# Patient Record
Sex: Male | Born: 1990 | Race: Black or African American | Hispanic: No | Marital: Single | Smoking: Current every day smoker
Health system: Southern US, Community
[De-identification: ages and names within clinical notes are randomized; demographics above are authoritative.]

---
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
## 2009-02-03 ENCOUNTER — Emergency Department (HOSPITAL_COMMUNITY): Admission: EM | Admit: 2009-02-03 | Discharge: 2009-02-03 | Payer: Self-pay | Admitting: Family Medicine

## 2010-08-28 LAB — GC/CHLAMYDIA PROBE AMP, GENITAL
Chlamydia, DNA Probe: NEGATIVE
GC Probe Amp, Genital: POSITIVE — AB

## 2011-02-18 LAB — GC/CHLAMYDIA PROBE AMP, GENITAL: GC Probe Amp, Genital: POSITIVE — AB

## 2011-02-18 LAB — POCT URINALYSIS DIP (DEVICE)
Nitrite: NEGATIVE
pH: 6.5

## 2011-02-18 LAB — URINE CULTURE
Colony Count: NO GROWTH
Culture: NO GROWTH

## 2011-12-19 ENCOUNTER — Ambulatory Visit (INDEPENDENT_AMBULATORY_CARE_PROVIDER_SITE_OTHER): Payer: Self-pay | Admitting: Family Medicine

## 2011-12-19 ENCOUNTER — Other Ambulatory Visit: Payer: Self-pay | Admitting: Family Medicine

## 2011-12-19 VITALS — BP 117/77 | HR 58 | Temp 98.2°F | Resp 16 | Ht 69.25 in | Wt 236.6 lb

## 2011-12-19 DIAGNOSIS — A5429 Other gonococcal genitourinary infections: Secondary | ICD-10-CM

## 2011-12-19 DIAGNOSIS — Z202 Contact with and (suspected) exposure to infections with a predominantly sexual mode of transmission: Secondary | ICD-10-CM

## 2011-12-19 DIAGNOSIS — Z2089 Contact with and (suspected) exposure to other communicable diseases: Secondary | ICD-10-CM

## 2011-12-19 DIAGNOSIS — A5401 Gonococcal cystitis and urethritis, unspecified: Secondary | ICD-10-CM

## 2011-12-19 LAB — HIV ANTIBODY (ROUTINE TESTING W REFLEX): HIV: NONREACTIVE

## 2011-12-19 LAB — RPR

## 2011-12-19 MED ORDER — CEFTRIAXONE SODIUM 1 G IJ SOLR
250.0000 mg | Freq: Once | INTRAMUSCULAR | Status: AC
  Administered 2011-12-19: 250 mg via INTRAMUSCULAR

## 2011-12-19 MED ORDER — CEFTRIAXONE SODIUM 1 G IJ SOLR: 250.0000 mg | INTRAMUSCULAR | Status: DC

## 2011-12-19 NOTE — Patient Instructions (Signed)
Your should receive a call or letter about your lab results within the next week to 10 days.  Return to the clinic or go to the nearest emergency room if any of your symptoms worsen or new symptoms occur.  

## 2011-12-19 NOTE — Progress Notes (Signed)
  Subjective:    Patient ID: Dylan Luna, male    DOB: 16-Nov-1990, 21 y.o.   MRN: [id number]  HPI Dylan Luna is a 21 y.o. male Partner recently diagnosed with gonorrhea.  Here for testing.  No dysuria or discharge, no pain in testicles or other symptoms. unproteced sex recently with this partner.   Few years ago had gonorrhea.  Treated with shot.  No know test of cure.      Review of Systems  Constitutional: Negative for fever and chills.  HENT: Negative for sore throat.   Respiratory: Negative for cough.   Genitourinary: Negative for dysuria, urgency, frequency, hematuria, difficulty urinating, penile pain and testicular pain.  Skin: Negative for rash.      Objective:   Physical Exam  Constitutional: He is oriented to person, place, and time. He appears well-developed and well-nourished.  HENT:  Head: Normocephalic and atraumatic.  Mouth/Throat: Oropharynx is clear and moist. No oropharyngeal exudate.  Eyes: EOM are normal. Pupils are equal, round, and reactive to light.  Pulmonary/Chest: Effort normal.  Genitourinary: Testes normal. Right testis shows no tenderness. Left testis shows no tenderness. No penile erythema or penile tenderness. Discharge (white discharge at urethral meatus.) found.  Lymphadenopathy:    He has no cervical adenopathy.       Right: No inguinal adenopathy present.       Left: No inguinal adenopathy present.  Neurological: He is alert and oriented to person, place, and time.  Skin: Skin is warm and dry. No rash noted.  Psychiatric: He has a normal mood and affect. His behavior is normal.          Assessment & Plan:  Dylan Luna is a 21 y.o. male 1. Exposure to STD  HIV antibody, RPR, GC/chlamydia probe amp, urine, Hepatitis C antibody, Hepatitis B surface antibody, Hepatitis B surface antigen, HSV(herpes simplex vrs) 1+2 ab-IgG, cefTRIAXone (ROCEPHIN) injection 250 mg, DISCONTINUED: cefTRIAXone (ROCEPHIN) injection 250 mg  2.  Gonococcal urethritis in male  HIV antibody, RPR, GC/chlamydia probe amp, urine, Hepatitis C antibody, Hepatitis B surface antibody, Hepatitis B surface antigen, HSV(herpes simplex vrs) 1+2 ab-IgG, cefTRIAXone (ROCEPHIN) injection 250 mg, DISCONTINUED: cefTRIAXone (ROCEPHIN) injection 250 mg   Rocephin 250mg  im given for gonorrhea exposure and likely gonococcal ureththritis Check other STI tests as above, abstinence or condoms next 1 week, and optional test of cure in 2-3 weeks.  RTC precautions.

## 2011-12-21 LAB — GC/CHLAMYDIA PROBE AMP, URINE: GC Probe Amp, Urine: NEGATIVE

## 2011-12-24 ENCOUNTER — Other Ambulatory Visit: Payer: Self-pay | Admitting: Family Medicine

## 2011-12-24 MED ORDER — AZITHROMYCIN 250 MG PO TABS: ORAL_TABLET | ORAL | Status: AC

## 2014-06-09 ENCOUNTER — Emergency Department (HOSPITAL_COMMUNITY)
Admission: EM | Admit: 2014-06-09 | Discharge: 2014-06-09 | Disposition: A | Discharge status: Home / Self Care | Payer: Self-pay | Attending: Emergency Medicine | Admitting: Emergency Medicine

## 2014-06-09 ENCOUNTER — Encounter (HOSPITAL_COMMUNITY): Payer: Self-pay | Admitting: *Deleted

## 2014-06-09 DIAGNOSIS — R21 Rash and other nonspecific skin eruption: Secondary | ICD-10-CM | POA: Insufficient documentation

## 2014-06-09 DIAGNOSIS — N4889 Other specified disorders of penis: Secondary | ICD-10-CM | POA: Insufficient documentation

## 2014-06-09 DIAGNOSIS — Z720 Tobacco use: Secondary | ICD-10-CM | POA: Insufficient documentation

## 2014-06-09 DIAGNOSIS — N509 Disorder of male genital organs, unspecified: Secondary | ICD-10-CM | POA: Insufficient documentation

## 2014-06-09 LAB — URINALYSIS, ROUTINE W REFLEX MICROSCOPIC
BILIRUBIN URINE: NEGATIVE
Glucose, UA: NEGATIVE mg/dL
KETONES UR: NEGATIVE mg/dL
LEUKOCYTES UA: NEGATIVE
NITRITE: NEGATIVE
PH: 5.5 (ref 5.0–8.0)
PROTEIN: NEGATIVE mg/dL
SPECIFIC GRAVITY, URINE: 1.031 — AB (ref 1.005–1.030)
UROBILINOGEN UA: 0.2 mg/dL (ref 0.0–1.0)

## 2014-06-09 LAB — URINE MICROSCOPIC-ADD ON

## 2014-06-09 MED ORDER — CEFTRIAXONE SODIUM 250 MG IJ SOLR
250.0000 mg | Freq: Once | INTRAMUSCULAR | Status: AC
  Administered 2014-06-09: 250 mg via INTRAMUSCULAR
  Filled 2014-06-09: qty 250

## 2014-06-09 MED ORDER — PENICILLIN G BENZATHINE 1200000 UNIT/2ML IM SUSP
2.4000 10*6.[IU] | Freq: Once | INTRAMUSCULAR | Status: AC
  Administered 2014-06-09: 2.4 10*6.[IU] via INTRAMUSCULAR
  Filled 2014-06-09: qty 4

## 2014-06-09 MED ORDER — AZITHROMYCIN 250 MG PO TABS
1000.0000 mg | ORAL_TABLET | Freq: Once | ORAL | Status: AC
  Administered 2014-06-09: 1000 mg via ORAL
  Filled 2014-06-09: qty 4

## 2014-06-09 NOTE — Discharge Instructions (Signed)
You have been evaluated for possible STD.  If tested positive, you will be notified.  Do not engage in any sexual activities until your rash has completely disappear.  Avoid shaving your penis to prevent rash.     Sexually Transmitted Disease A sexually transmitted disease (STD) is a disease or infection that may be passed (transmitted) from person to person, usually during sexual activity. This may happen by way of saliva, semen, blood, vaginal mucus, or urine. Common STDs include:   Gonorrhea.   Chlamydia.   Syphilis.   HIV and AIDS.   Genital herpes.   Hepatitis B and C.   Trichomonas.   Human papillomavirus (HPV).   Pubic lice.   Scabies.  Mites.  Bacterial vaginosis. WHAT ARE CAUSES OF STDs? An STD may be caused by bacteria, a virus, or parasites. STDs are often transmitted during sexual activity if one person is infected. However, they may also be transmitted through nonsexual means. STDs may be transmitted after:   Sexual intercourse with an infected person.   Sharing sex toys with an infected person.   Sharing needles with an infected person or using unclean piercing or tattoo needles.  Having intimate contact with the genitals, mouth, or rectal areas of an infected person.   Exposure to infected fluids during birth. WHAT ARE THE SIGNS AND SYMPTOMS OF STDs? Different STDs have different symptoms. Some people may not have any symptoms. If symptoms are present, they may include:   Painful or bloody urination.   Pain in the pelvis, abdomen, vagina, anus, throat, or eyes.   A skin rash, itching, or irritation.  Growths, ulcerations, blisters, or sores in the genital and anal areas.  Abnormal vaginal discharge with or without bad odor.   Penile discharge in men.   Fever.   Pain or bleeding during sexual intercourse.   Swollen glands in the groin area.   Yellow skin and eyes (jaundice). This is seen with hepatitis.   Swollen  testicles.  Infertility.  Sores and blisters in the mouth. HOW ARE STDs DIAGNOSED? To make a diagnosis, your health care provider may:   Take a medical history.   Perform a physical exam.   Take a sample of any discharge to examine.  Swab the throat, cervix, opening to the penis, rectum, or vagina for testing.  Test a sample of your first morning urine.   Perform blood tests.   Perform a Pap test, if this applies.   Perform a colposcopy.   Perform a laparoscopy.  HOW ARE STDs TREATED? Treatment depends on the STD. Some STDs may be treated but not cured.   Chlamydia, gonorrhea, trichomonas, and syphilis can be cured with antibiotic medicine.   Genital herpes, hepatitis, and HIV can be treated, but not cured, with prescribed medicines. The medicines lessen symptoms.   Genital warts from HPV can be treated with medicine or by freezing, burning (electrocautery), or surgery. Warts may come back.   HPV cannot be cured with medicine or surgery. However, abnormal areas may be removed from the cervix, vagina, or vulva.   If your diagnosis is confirmed, your recent sexual partners need treatment. This is true even if they are symptom-free or have a negative culture or evaluation. They should not have sex until their health care providers say it is okay. HOW CAN I REDUCE MY RISK OF GETTING AN STD? Take these steps to reduce your risk of getting an STD:  Use latex condoms, dental dams, and water-soluble lubricants during sexual  activity. Do not use petroleum jelly or oils.  Avoid having multiple sex partners.  Do not have sex with someone who has other sex partners.  Do not have sex with anyone you do not know or who is at high risk for an STD.  Avoid risky sex practices that can break your skin.  Do not have sex if you have open sores on your mouth or skin.  Avoid drinking too much alcohol or taking illegal drugs. Alcohol and drugs can affect your judgment and put  you in a vulnerable position.  Avoid engaging in oral and anal sex acts.  Get vaccinated for HPV and hepatitis. If you have not received these vaccines in the past, talk to your health care provider about whether one or both might be right for you.   If you are at risk of being infected with HIV, it is recommended that you take a prescription medicine daily to prevent HIV infection. This is called pre-exposure prophylaxis (PrEP). You are considered at risk if:  You are a man who has sex with other men (MSM).  You are a heterosexual man or woman and are sexually active with more than one partner.  You take drugs by injection.  You are sexually active with a partner who has HIV.  Talk with your health care provider about whether you are at high risk of being infected with HIV. If you choose to begin PrEP, you should first be tested for HIV. You should then be tested every 3 months for as long as you are taking PrEP.  WHAT SHOULD I DO IF I THINK I HAVE AN STD?  See your health care provider.   Tell your sexual partner(s). They should be tested and treated for any STDs.  Do not have sex until your health care provider says it is okay. WHEN SHOULD I GET IMMEDIATE MEDICAL CARE? Contact your health care provider right away if:   You have severe abdominal pain.  You are a man and notice swelling or pain in your testicles.  You are a woman and notice swelling or pain in your vagina. Document Released: 07/31/2002 Document Revised: 05/15/2013 Document Reviewed: 11/28/2012 [facility] Patient Information 2015 [place], [place]. This information is not intended to replace advice given to you by your health care provider. Make sure you discuss any questions you have with your health care provider.

## 2014-06-09 NOTE — ED Notes (Signed)
Declined W/C at D/C and was escorted to lobby by RN. 

## 2014-06-09 NOTE — ED Provider Notes (Signed)
CSN: [id number]     Arrival date & time 06/09/14  0902 History  This chart was scribed for Fayrene HelperBowie Alon Mazor, PA-C, working with Merrie RoofJohn David Wofford III, * by Chestine SporeSoijett Blue, ED Scribe. The patient was seen in room TR11C/TR11C at 9:24 AM.    Chief Complaint  Patient presents with  . Laceration    The history is provided by the patient. No language interpreter was used.    HPI Comments: Dylan Luna is a 24 y.o. male who presents to the Emergency Department complaining of lesion on penis onset 2 weeks ago. He notes that he may have shaved and he accidentally knicked the skin on penis. He didn't notice the cut initially. He noticed that the area opened up and began to scab over. He noticed a spot 1 week ago with no pain. Two days ago he noticed he had painful urination and that his penis was throbbing. on his penis that is painful. He notes that he has two sexual partners and that he uses condoms for intercourse but not for oral sex. He would like to be tested for a STD as well. He states that he is having associated symptoms of dysuria, genital sore, and penile pain. He denies penile discharge, fever, abdominal pain, back pain, testicular pain, and any other symptoms. Denies any prior STD, but he has been tested before. Denies being allergic to any medication. He reports that he is otherwise healthy.   History reviewed. No pertinent past medical history. History reviewed. No pertinent past surgical history. History reviewed. No pertinent family history. History  Substance Use Topics  . Smoking status: Current Every Day Smoker    Types: Cigarettes  . Smokeless tobacco: Not on file  . Alcohol Use: No    Review of Systems  Gastrointestinal: Negative for abdominal pain.  Genitourinary: Positive for dysuria, genital sores and penile pain. Negative for discharge and testicular pain.  Musculoskeletal: Negative for back pain.      Allergies  Review of patient's allergies indicates no known  allergies.  Home Medications   Prior to Admission medications   Not on File   BP 136/74 mmHg  Pulse 78  Temp(Src) 98.2 F (36.8 C) (Oral)  Resp 16  SpO2 98%  Physical Exam  Constitutional: He is oriented to person, place, and time. He appears well-developed and well-nourished. No distress.  HENT:  Head: Normocephalic and atraumatic.  Eyes: EOM are normal.  Neck: Neck supple. No tracheal deviation present.  Cardiovascular: Normal rate.   Pulmonary/Chest: Effort normal. No respiratory distress.  Abdominal: Soft. There is no tenderness. Hernia confirmed negative in the right inguinal area and confirmed negative in the left inguinal area.  Genitourinary: Testes normal.    Circumcised. No penile tenderness. No discharge found.  Non healing ulceration noted to th penile shaft without any exudate or puss. Non-tender to palpation. Multiple follicular regions noted throughout the shaft without puss or exudate. No penile discharge. No hernia noted.   Musculoskeletal: Normal range of motion.  Neurological: He is alert and oriented to person, place, and time.  Skin: Skin is warm and dry.  Psychiatric: He has a normal mood and affect. His behavior is normal.  Nursing note and vitals reviewed.   ED Course  Procedures (including critical care time) DIAGNOSTIC STUDIES: Oxygen Saturation is 98% on room air, normal by my interpretation.    COORDINATION OF CARE: 9:29 AM-Discussed treatment plan which includes penicillin, RPR, HIV antibody, UA with pt at bedside and pt agreed  to plan.   9:31 AM- pt was tested and positive for gonorrhea at [facility] 2010. 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   9:58 AM Patient here with a nonhealing nontender ulcer to the shaft of his penis in which she correspond with shaving. It appears to be a chancre concerning for syphilis. Therefore, STD panel testing was ordered. Patient will be treated prophylactically with penicillin G 2.4  million unit along with Rocephin and Zithromax. Patient warn about possible Jarisch-Herxheimer reaction from receiving medication. There are also smaller lesions along the penile shaft, suspect herpetic lesion that has deroofed.  Doubt that it was from cuts due to shaving.  Patient made aware to avoid sexual activities until symptoms completely resolved. Safe Sex practices discussed.  Labs Review Labs Reviewed  URINALYSIS, ROUTINE W REFLEX MICROSCOPIC - Abnormal; Notable for the following:    Specific Gravity, Urine 1.031 (*)    Hgb urine dipstick SMALL (*)    All other components within normal limits  URINE MICROSCOPIC-ADD ON  RPR  HIV ANTIBODY (ROUTINE TESTING)  GC/CHLAMYDIA PROBE AMP ([place])    Imaging Review No results found.   EKG Interpretation None      MDM   Final diagnoses:  Penile rash    BP 136/74 mmHg  Pulse 78  Temp(Src) 98.2 F (36.8 C) (Oral)  Resp 16  SpO2 98%   I personally performed the services described in this documentation, which was scribed in my presence. The recorded information has been reviewed and is accurate.    Fayrene Helper, PA-C 06/09/14 1031  Candyce Churn III, MD 06/10/14 [phone]

## 2014-06-09 NOTE — ED Notes (Addendum)
Pt reports accidentally cutting his penis several days ago while shaving and wants it evaluated. Denies any discharge from wound or fever. Also wants evaluated for std.

## 2014-06-10 LAB — HIV ANTIBODY (ROUTINE TESTING W REFLEX): HIV 1/HIV 2 AB: NONREACTIVE

## 2014-06-10 LAB — GC/CHLAMYDIA PROBE AMP (~~LOC~~) NOT AT ARMC
CHLAMYDIA, DNA PROBE: NEGATIVE
NEISSERIA GONORRHEA: NEGATIVE

## 2014-06-17 LAB — RPR: RPR: NONREACTIVE

## 2016-04-12 ENCOUNTER — Encounter (HOSPITAL_COMMUNITY): Payer: Self-pay | Admitting: Emergency Medicine

## 2016-04-12 ENCOUNTER — Emergency Department (HOSPITAL_COMMUNITY)
Admission: EM | Admit: 2016-04-12 | Discharge: 2016-04-12 | Disposition: A | Discharge status: Home / Self Care | Payer: Self-pay | Attending: Emergency Medicine | Admitting: Emergency Medicine

## 2016-04-12 DIAGNOSIS — R300 Dysuria: Secondary | ICD-10-CM | POA: Insufficient documentation

## 2016-04-12 DIAGNOSIS — F17210 Nicotine dependence, cigarettes, uncomplicated: Secondary | ICD-10-CM | POA: Insufficient documentation

## 2016-04-12 DIAGNOSIS — Z202 Contact with and (suspected) exposure to infections with a predominantly sexual mode of transmission: Secondary | ICD-10-CM | POA: Insufficient documentation

## 2016-04-12 LAB — URINALYSIS, ROUTINE W REFLEX MICROSCOPIC
Bilirubin Urine: NEGATIVE
GLUCOSE, UA: NEGATIVE mg/dL
HGB URINE DIPSTICK: NEGATIVE
KETONES UR: NEGATIVE mg/dL
Leukocytes, UA: NEGATIVE
Nitrite: NEGATIVE
PH: 6.5 (ref 5.0–8.0)
PROTEIN: NEGATIVE mg/dL
Specific Gravity, Urine: 1.031 — ABNORMAL HIGH (ref 1.005–1.030)

## 2016-04-12 LAB — GC/CHLAMYDIA PROBE AMP (~~LOC~~) NOT AT ARMC
Chlamydia: NEGATIVE
Neisseria Gonorrhea: NEGATIVE

## 2016-04-12 LAB — RPR: RPR Ser Ql: NONREACTIVE

## 2016-04-12 LAB — HIV ANTIBODY (ROUTINE TESTING W REFLEX): HIV SCREEN 4TH GENERATION: NONREACTIVE

## 2016-04-12 MED ORDER — METRONIDAZOLE 500 MG PO TABS
2000.0000 mg | ORAL_TABLET | Freq: Once | ORAL | Status: AC
  Administered 2016-04-12: 2000 mg via ORAL
  Filled 2016-04-12: qty 4

## 2016-04-12 NOTE — Discharge Instructions (Signed)
1. Medications: usual home medications °2. Treatment: rest, drink plenty of fluids, use a condom with every sexual encounter °3. Follow Up: Please followup with your primary doctor in 3 days for discussion of your diagnoses and further evaluation after today's visit; if you do not have a primary care doctor use the resource guide provided to find one; Please return to the ER for worsening symptoms, high fevers or persistent vomiting. ° °You have been tested for HIV, syphilis, chlamydia and gonorrhea.  These results will be available in approximately 3 days.  Please inform all sexual partners if you test positive for any of these diseases.  ° °[facility] [place] [facility] tests for every STD. There are 9 other clinics in [place], but only 3 of them offer such comprehensive testing. ° °Services Offered: °Testing Services  °Chlamydia Testing °Conventional Blood HIV Testing °Gonorrhea Testing °Hepatitis B Testing °Hepatitis C Testing °Herpes Testing °Syphilis Testing °TB Testing ° °Vaccines And Treatments:  °Hepatitis B Vaccine °Hepatitis A Vaccine °HPV Vaccine °TB Treatment °Gynecological Care °Family Planning ° °Prevention Services:  °HIV Test Counseling °HIV/AIDS Prevention Education °Safer Sex Education °Spe[employer] [place] °STD Prevention/Education °TB Prevention Education ° °Languages Spoken: °English °Spanish ° °Hours of Operation: °Note: Please contact the organization for hours of operation. Disclaimer: Hours of peration change frequently. Please contact the organization to verify. °Appointment Required: Yes ° °Contact Information: °Phone [phone] °Other Phones [phone] (Domestic Fax) ° °Address: °[address] °[place], [place] [address] ° °Website: [email or web address]  °

## 2016-04-12 NOTE — ED Triage Notes (Signed)
Pt reports being exposed to trichomonas. Pt also reports burning when urinating.

## 2016-04-12 NOTE — ED Provider Notes (Signed)
[facility] Provider Note   CSN: [id number] Arrival date & time: 04/12/16  0703     History   Chief Complaint Chief Complaint  Patient presents with  . Exposure to STD    HPI Dylan Luna is a 25 y.o. male.  HPI   Patient is a 25 year old male who presents the ED with complaint of a STD exposure. Patient reports he was recently told by one of his sexual partners that she was diagnosed which minus. Patient also reports he has been having dysuria for the past 3-4 days. Denies fever, chills, or a lesion, sore throat, abdominal pain, nausea, vomiting, hematuria, he now discharge, penile or testicular pain/swelling, rash. Patient reports he was sexually active with 4 sexual partners and reports only using condoms with 2 of his sexual partners. Denies any other known STD exposure.  History reviewed. No pertinent past medical history.  There are no active problems to display for this patient.   History reviewed. No pertinent surgical history.     Home Medications    Prior to Admission medications   Not on File    Family History No family history on file.  Social History Social History  Substance Use Topics  . Smoking status: Current Every Day Smoker    Types: Cigarettes  . Smokeless tobacco: Never Used  . Alcohol use No     Allergies   Patient has no known allergies.   Review of Systems Review of Systems  Genitourinary: Positive for dysuria.  All other systems reviewed and are negative.    Physical Exam Updated Vital Signs BP 143/91 (BP Location: Left Arm)   Pulse 76   Temp 98.4 F (36.9 C) (Oral)   Resp 18   Ht 5\' 11"  (1.803 m)   Wt 117.9 kg   SpO2 98%   BMI 36.26 kg/m   Physical Exam  Constitutional: He is oriented to person, place, and time. He appears well-developed and well-nourished.  HENT:  Head: Normocephalic and atraumatic.  Mouth/Throat: Uvula is midline, oropharynx is clear and moist and mucous membranes are normal. No  oral lesions. No oropharyngeal exudate, posterior oropharyngeal edema, posterior oropharyngeal erythema or tonsillar abscesses. No tonsillar exudate.  Eyes: Conjunctivae and EOM are normal. Right eye exhibits no discharge. Left eye exhibits no discharge. No scleral icterus.  Neck: Normal range of motion. Neck supple.  Cardiovascular: Normal rate, regular rhythm, normal heart sounds and intact distal pulses.   Pulmonary/Chest: Effort normal and breath sounds normal. No respiratory distress. He has no wheezes. He has no rales. He exhibits no tenderness.  Abdominal: Soft. Bowel sounds are normal. He exhibits no distension and no mass. There is no tenderness. There is no rebound and no guarding. No hernia. Hernia confirmed negative in the right inguinal area and confirmed negative in the left inguinal area.  Genitourinary: Testes normal and penis normal. Right testis shows no mass, no swelling and no tenderness. Left testis shows no mass, no swelling and no tenderness. Circumcised. No hypospadias, penile erythema or penile tenderness. No discharge found.  Musculoskeletal: He exhibits no edema.  Lymphadenopathy: No inguinal adenopathy noted on the right or left side.  Neurological: He is alert and oriented to person, place, and time.  Skin: Skin is warm and dry.  Nursing note and vitals reviewed.    ED Treatments / Results  Labs (all labs ordered are listed, but only abnormal results are displayed) Labs Reviewed  URINALYSIS, ROUTINE W REFLEX MICROSCOPIC (NOT AT [facility]) - Abnormal; Notable  for the following:       Result Value   Specific Gravity, Urine 1.031 (*)    All other components within normal limits  RPR  HIV ANTIBODY (ROUTINE TESTING)  GC/CHLAMYDIA PROBE AMP ([place]) NOT [facility]    EKG  EKG Interpretation None       Radiology No results found.  Procedures Procedures (including critical care time)  Medications Ordered in ED Medications  metroNIDAZOLE (FLAGYL) tablet  2,000 mg (not administered)     Initial Impression / Assessment and Plan / ED Course  I have reviewed the triage vital signs and the nursing notes.  Pertinent labs & imaging results that were available during my care of the patient were reviewed by me and considered in my medical decision making (see chart for details).  Clinical Course     Patient is afebrile without abdominal tenderness, abdominal pain or painful bowel movements to indicate prostatitis.  No tenderness to palpation of the testes or epididymis to suggest orchitis or epididymitis.  STD cultures obtained including HIV, syphilis, gonorrhea and chlamydia. UA negative. Patient to be discharged with instructions to follow up with PCP. Discussed importance of using protection when sexually active. Pt understands that they have GC/Chlamydia cultures pending and that they will need to inform all sexual partners if results return positive. Patient has been treated in the ED with flagyl for trich exposure.      Final Clinical Impressions(s) / ED Diagnoses   Final diagnoses:  STD exposure    New Prescriptions New Prescriptions   No medications on file     Barrett Henleicole Elizabeth Nadeau, PA-C 04/12/16 [phone]    Pricilla LovelessScott Goldston, MD 04/17/16 [id number]

## 2016-06-19 ENCOUNTER — Emergency Department (HOSPITAL_COMMUNITY)
Admission: EM | Admit: 2016-06-19 | Discharge: 2016-06-19 | Disposition: A | Discharge status: Home / Self Care | Payer: Self-pay | Attending: Emergency Medicine | Admitting: Emergency Medicine

## 2016-06-19 ENCOUNTER — Emergency Department (HOSPITAL_COMMUNITY): Payer: Self-pay

## 2016-06-19 ENCOUNTER — Encounter (HOSPITAL_COMMUNITY): Payer: Self-pay

## 2016-06-19 DIAGNOSIS — W010XXA Fall on same level from slipping, tripping and stumbling without subsequent striking against object, initial encounter: Secondary | ICD-10-CM | POA: Insufficient documentation

## 2016-06-19 DIAGNOSIS — Y9389 Activity, other specified: Secondary | ICD-10-CM | POA: Insufficient documentation

## 2016-06-19 DIAGNOSIS — M25512 Pain in left shoulder: Secondary | ICD-10-CM | POA: Insufficient documentation

## 2016-06-19 DIAGNOSIS — F17210 Nicotine dependence, cigarettes, uncomplicated: Secondary | ICD-10-CM | POA: Insufficient documentation

## 2016-06-19 DIAGNOSIS — Y929 Unspecified place or not applicable: Secondary | ICD-10-CM | POA: Insufficient documentation

## 2016-06-19 DIAGNOSIS — Y999 Unspecified external cause status: Secondary | ICD-10-CM | POA: Insufficient documentation

## 2016-06-19 MED ORDER — METHOCARBAMOL 500 MG PO TABS: 500.0000 mg | ORAL_TABLET | Freq: Two times a day (BID) | ORAL | 0 refills | Status: AC

## 2016-06-19 MED ORDER — LIDOCAINE 5 % EX PTCH: 1.0000 | MEDICATED_PATCH | CUTANEOUS | 0 refills | Status: AC

## 2016-06-19 MED ORDER — NAPROXEN 500 MG PO TABS: 500.0000 mg | ORAL_TABLET | Freq: Two times a day (BID) | ORAL | 0 refills | Status: AC

## 2016-06-19 MED ORDER — ACETAMINOPHEN 325 MG PO TABS
650.0000 mg | ORAL_TABLET | Freq: Once | ORAL | Status: AC
  Administered 2016-06-19: 650 mg via ORAL

## 2016-06-19 MED ORDER — ACETAMINOPHEN 325 MG PO TABS
ORAL_TABLET | ORAL | Status: AC
  Filled 2016-06-19: qty 2

## 2016-06-19 NOTE — ED Triage Notes (Signed)
Pt reports he slipped and fell on the ice 2 weeks ago. He reports pain in the left shoulder/shoulder blade area. Pt able to move the extremity.

## 2016-06-19 NOTE — ED Provider Notes (Signed)
[facility]EM[facility]ENC[facility] DEPT Provider Note   CSN: [id number] Arrival date & time: 06/19/16  1511     History   Chief Complaint Chief Complaint  Patient presents with  . Shoulder Injury    HPI Dylan Luna is a 26 y.o. male.  The history is provided by the patient and medical records.  Shoulder Injury     26 y.o. M with no PMH presenting to the ED for left shoulder pain.  Patient reports he was playing in the snow 2 week ago with his nieces and nephews.  States he fell and rolled over onto concrete Without knowing it. States since this time he had some pain in his left posterior shoulder. States pain is worse when reaching across his chest from left to right.  Does report pain is worse in the morning, better throughout the day the more he moves. He denies any numbness or weakness of his left arm. No wrist drop. Patient is left-hand dominant. 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 He has tried icing his shoulder without any significant relief.  History reviewed. No pertinent past medical history.  There are no active problems to display for this patient.   History reviewed. No pertinent surgical history.     Home Medications    Prior to Admission medications   Not on File    Family History History reviewed. No pertinent family history.  Social History Social History  Substance Use Topics  . Smoking status: Current Every Day Smoker    Types: Cigarettes  . Smokeless tobacco: Never Used  . Alcohol use No     Allergies   Patient has no known allergies.   Review of Systems Review of Systems  Musculoskeletal: Positive for arthralgias.  All other systems reviewed and are negative.    Physical Exam Updated Vital Signs BP 142/82 (BP Location: Left Arm)   Pulse 100   Temp 101.3 F (38.5 C) (Oral)   Resp 16   SpO2 98%   Physical Exam  Constitutional: He is oriented to person, place, and time.  He appears well-developed and well-nourished.  HENT:  Head: Normocephalic and atraumatic.  Mouth/Throat: Oropharynx is clear and moist.  Eyes: Conjunctivae and EOM are normal. Pupils are equal, round, and reactive to light.  Neck: Normal range of motion.  Cardiovascular: Normal rate, regular rhythm and normal heart sounds.   Pulmonary/Chest: Effort normal and breath sounds normal. No respiratory distress. He has no wheezes.  Abdominal: Soft. Bowel sounds are normal. There is no tenderness. There is no rebound.  Musculoskeletal: Normal range of motion.  Left shoulder normal in appearance without signs of dislocation; there is noted pain in the trapezius muscle with reaching across chest with left arm towards right side, however full ROM maintained in all directions; normal grip strength; no wrist drop; normal sensation throughout left arm  Neurological: He is alert and oriented to person, place, and time.  Skin: Skin is warm and dry.  Psychiatric: He has a normal mood and affect.  Nursing note and vitals reviewed.    ED Treatments / Results  Labs (all labs ordered are listed, but only abnormal results are displayed) Labs Reviewed - No data to display  EKG  EKG Interpretation None       Radiology Dg Shoulder Left  Result Date: 06/19/2016 CLINICAL DATA:  Superior left shoulder pain. EXAM: LEFT SHOULDER - 2+ VIEW COMPARISON:  None. FINDINGS: There is no  evidence of fracture or dislocation. There is no evidence of arthropathy or other focal bone abnormality. Soft tissues are unremarkable. IMPRESSION: Negative. Electronically Signed   By: Ted Mcalpineobrinka  Dimitrova M.D.   On: 06/19/2016 16:47    Procedures Procedures (including critical care time)  Medications Ordered in ED Medications  acetaminophen (TYLENOL) 325 MG tablet (not administered)  acetaminophen (TYLENOL) tablet 650 mg (650 mg Oral Given 06/19/16 1546)     Initial Impression / Assessment and Plan / ED Course  I have  reviewed the triage vital signs and the nursing notes.  Pertinent labs & imaging results that were available during my care of the patient were reviewed by me and considered in my medical decision making (see chart for details).  26 year old male here with left shoulder pain after fall 2 weeks ago in the snow. No deformities noted on exam. Arm is neurovascularly intact with full range of motion maintained. X-ray obtained, no acute bony findings.  Will plan to discharge home with supportive care.  Will refer to orthopedics for any ongoing issues. Discussed plan with patient, he acknowledged understanding and agreed with plan of care.  Return precautions given for new or worsening symptoms.  Final Clinical Impressions(s) / ED Diagnoses   Final diagnoses:  Acute pain of left shoulder    New Prescriptions Discharge Medication List as of 06/19/2016  5:02 PM    START taking these medications   Details  lidocaine (LIDODERM) 5 % Place 1 patch onto the skin daily. Remove & Discard patch within 12 hours or as directed by MD, Starting Sat 06/19/2016, Print    methocarbamol (ROBAXIN) 500 MG tablet Take 1 tablet (500 mg total) by mouth 2 (two) times daily., Starting Sat 06/19/2016, Print    naproxen (NAPROSYN) 500 MG tablet Take 1 tablet (500 mg total) by mouth 2 (two) times daily with a meal., Starting Sat 06/19/2016, Print         Garlon HatchetLisa M Virgal [place], PA-C 06/19/16 1807    Rolland PorterMark James, MD 07/01/16 [id number]

## 2016-06-19 NOTE — Discharge Instructions (Signed)
Take the prescribed medication as directed. Follow-up with ortho if you continue having issues. May  call to make appt. Return to the ED for new or worsening symptoms.

## 2016-06-22 ENCOUNTER — Encounter (HOSPITAL_COMMUNITY): Payer: Self-pay | Admitting: Emergency Medicine

## 2016-06-22 ENCOUNTER — Emergency Department (HOSPITAL_COMMUNITY)
Admission: EM | Admit: 2016-06-22 | Discharge: 2016-06-22 | Disposition: A | Discharge status: Home / Self Care | Payer: Self-pay | Attending: Emergency Medicine | Admitting: Emergency Medicine

## 2016-06-22 DIAGNOSIS — F17210 Nicotine dependence, cigarettes, uncomplicated: Secondary | ICD-10-CM | POA: Insufficient documentation

## 2016-06-22 DIAGNOSIS — B9789 Other viral agents as the cause of diseases classified elsewhere: Secondary | ICD-10-CM

## 2016-06-22 DIAGNOSIS — J069 Acute upper respiratory infection, unspecified: Secondary | ICD-10-CM | POA: Insufficient documentation

## 2016-06-22 MED ORDER — BENZONATATE 100 MG PO CAPS: 100.0000 mg | ORAL_CAPSULE | Freq: Three times a day (TID) | ORAL | 0 refills | Status: AC

## 2016-06-22 MED ORDER — IBUPROFEN 600 MG PO TABS: 600.0000 mg | ORAL_TABLET | Freq: Four times a day (QID) | ORAL | 0 refills | Status: AC | PRN

## 2016-06-22 NOTE — Discharge Instructions (Signed)
Please read and follow all provided instructions.  Your diagnoses today include:  1. Viral URI with cough    You appear to have an upper respiratory infection (URI). An upper respiratory tract infection, or cold, is a viral infection of the air passages leading to the lungs. It should improve gradually after 5-7 days. You may have a lingering cough that lasts for 2- 4 weeks after the infection.  Tests performed today include: Vital signs. See below for your results today.   Medications prescribed:   Take any prescribed medications only as directed. Treatment for your infection is aimed at treating the symptoms. There are no medications, such as antibiotics, that will cure your infection.   Home care instructions:  Follow any educational materials contained in this packet.   Your illness is contagious and can be spread to others, especially during the first 3 or 4 days. It cannot be cured by antibiotics or other medicines. Take basic precautions such as washing your hands often, covering your mouth when you cough or sneeze, and avoiding public places where you could spread your illness to others.   Please continue drinking plenty of fluids.  Use over-the-counter medicines as needed as directed on packaging for symptom relief.  You may also use ibuprofen or tylenol as directed on packaging for pain or fever.  Do not take multiple medicines containing Tylenol or acetaminophen to avoid taking too much of this medication.  Follow-up instructions: Please follow-up with your primary care provider in the next 3 days for further evaluation of your symptoms if you are not feeling better.   Return instructions:  Please return to the Emergency Department if you experience worsening symptoms.  RETURN IMMEDIATELY IF you develop shortness of breath, confusion or altered mental status, a new rash, become dizzy, faint, or poorly responsive, or are unable to be cared for at home. Please return if you have  persistent vomiting and cannot keep down fluids or develop a fever that is not controlled by tylenol or motrin.   Please return if you have any other emergent concerns.  Additional Information:  Your vital signs today were: BP 126/78    Pulse 92    Temp 98.8 F (37.1 C) (Oral)    Resp 16    SpO2 99%  If your blood pressure (BP) was elevated above 135/85 this visit, please have this repeated by your doctor within one month. --------------

## 2016-06-22 NOTE — ED Triage Notes (Signed)
Pt c/o cold symptoms, cough, chest congestion, chills, fatigue, mild headaches. Pt in nad.

## 2016-06-22 NOTE — ED Provider Notes (Signed)
[facility] Provider Note   CSN: [id number] Arrival date & time: 06/22/16  [id number]   By signing my name below, I, Clovis PuAvnee Patel, attest that this documentation has been prepared under the direction and in the presence of  Audry Piliyler Darlena Koval, PA-C. Electronically Signed: Clovis PuAvnee Patel, ED Scribe. 06/22/16. 10:15 AM.   History   Chief Complaint Chief Complaint  Patient presents with  . Cough   The history is provided by the patient. No language interpreter was used.   HPI Comments:  Dylan Luna is a 26 y.o. male who presents to the Emergency Department complaining of dry cough x 4-5 days. Pt also reports sore throat, headache, generalized body aches, chest pain secondary to coughing, chills and subjective fever. He has taken alka seltzer with no relief. Pt denies nasal congestion, nausea, vomiting, diarrhea, constipation and any recent sick contacts. No other associated symptoms noted.   History reviewed. No pertinent past medical history.  There are no active problems to display for this patient.   History reviewed. No pertinent surgical history.   Home Medications    Prior to Admission medications   Medication Sig Start Date End Date Taking? Authorizing Provider  lidocaine (LIDODERM) 5 % Place 1 patch onto the skin daily. Remove & Discard patch within 12 hours or as directed by MD 06/19/16   Garlon HatchetLisa M Sanders, PA-C  methocarbamol (ROBAXIN) 500 MG tablet Take 1 tablet (500 mg total) by mouth 2 (two) times daily. 06/19/16   Garlon HatchetLisa M Sanders, PA-C  naproxen (NAPROSYN) 500 MG tablet Take 1 tablet (500 mg total) by mouth 2 (two) times daily with a meal. 06/19/16   Garlon HatchetLisa M Sanders, PA-C    Family History No family history on file.  Social History Social History  Substance Use Topics  . Smoking status: Current Every Day Smoker    Types: Cigarettes  . Smokeless tobacco: Never Used  . Alcohol use No   Allergies   Patient has no known allergies.   Review of Systems Review of  Systems  Constitutional: Positive for chills. Negative for fever.  HENT: Positive for sore throat. Negative for congestion.   Respiratory: Positive for cough.   Gastrointestinal: Negative for constipation, diarrhea, nausea and vomiting.  Musculoskeletal: Positive for myalgias.  Neurological: Positive for headaches.   Physical Exam Updated Vital Signs BP 126/78   Pulse 92   Temp 98.8 F (37.1 C) (Oral)   Resp 16   SpO2 99%   Physical Exam  Constitutional: He is oriented to person, place, and time. He appears well-developed and well-nourished. No distress.  HENT:  Head: Normocephalic and atraumatic.  Right Ear: Tympanic membrane, external ear and ear canal normal.  Left Ear: Tympanic membrane, external ear and ear canal normal.  Nose: Nose normal.  Mouth/Throat: Uvula is midline, oropharynx is clear and moist and mucous membranes are normal. No trismus in the jaw. No oropharyngeal exudate, posterior oropharyngeal erythema or tonsillar abscesses.  Eyes: Conjunctivae and EOM are normal. Pupils are equal, round, and reactive to light.  Neck: Normal range of motion. Neck supple. No tracheal deviation present.  Cardiovascular: Normal rate, regular rhythm, S1 normal, S2 normal, normal heart sounds, intact distal pulses and normal pulses.   Pulmonary/Chest: Effort normal and breath sounds normal. No respiratory distress. He has no decreased breath sounds. He has no wheezes. He has no rhonchi. He has no rales.  Abdominal: Normal appearance and bowel sounds are normal. He exhibits no distension. There is no tenderness.  Musculoskeletal: Normal  range of motion.  Neurological: He is alert and oriented to person, place, and time.  Skin: Skin is warm and dry.  Psychiatric: He has a normal mood and affect. His speech is normal and behavior is normal. Thought content normal.  Nursing note and vitals reviewed.  ED Treatments / Results  DIAGNOSTIC STUDIES:  Oxygen Saturation is 99% on RA, normal  by my interpretation.    COORDINATION OF CARE:  10:14 AM Discussed treatment plan with pt at bedside and pt agreed to plan.  Labs (all labs ordered are listed, but only abnormal results are displayed) Labs Reviewed - No data to display  EKG  EKG Interpretation None      Radiology No results found.  Procedures Procedures (including critical care time)  Medications Ordered in ED Medications - No data to display   Initial Impression / Assessment and Plan / ED Course  I have reviewed the triage vital signs and the nursing notes.  Pertinent labs & imaging results that were available during my care of the patient were reviewed by me and considered in my medical decision making (see chart for details).  Final Clinical Impressions(s) / ED Diagnoses     {I have reviewed the relevant previous healthcare records.  {I obtained HPI from historian.   ED Course:  Assessment: Pt is a 25yM presents with URI x 2-3 days . On exam, pt in NAD. VSS. Afebrile. Lungs CTA, Heart RRR. Abdomen nontender/soft. Patients symptoms are consistent with URI, likely viral etiology. Discussed that antibiotics are not indicated for viral infections. Pt will be discharged with symptomatic treatment.  Verbalizes understanding and is agreeable with plan. Pt is hemodynamically stable & in NAD prior to dc.  Disposition/Plan:  DC Home Additional Verbal discharge instructions given and discussed with patient.  Pt Instructed to f/u with PCP in the next week for evaluation and treatment of symptoms. Return precautions given Pt acknowledges and agrees with plan  Supervising Physician Marily Memos, MD  Final diagnoses:  Viral URI with cough    New Prescriptions New Prescriptions   No medications on file   I personally performed the services described in this documentation, which was scribed in my presence. The recorded information has been reviewed and is accurate.    Audry Pili, PA-C 06/22/16 1018      Marily Memos, MD 06/22/16 [phone]

## 2018-02-23 IMAGING — DX DG SHOULDER 2+V*L*
3 series · 3 of 3 positions shown · non-contrast
Comparison: None.

CLINICAL DATA: Superior left shoulder pain.

EXAM:
LEFT SHOULDER - 2+ VIEW

[shoulder grashey]
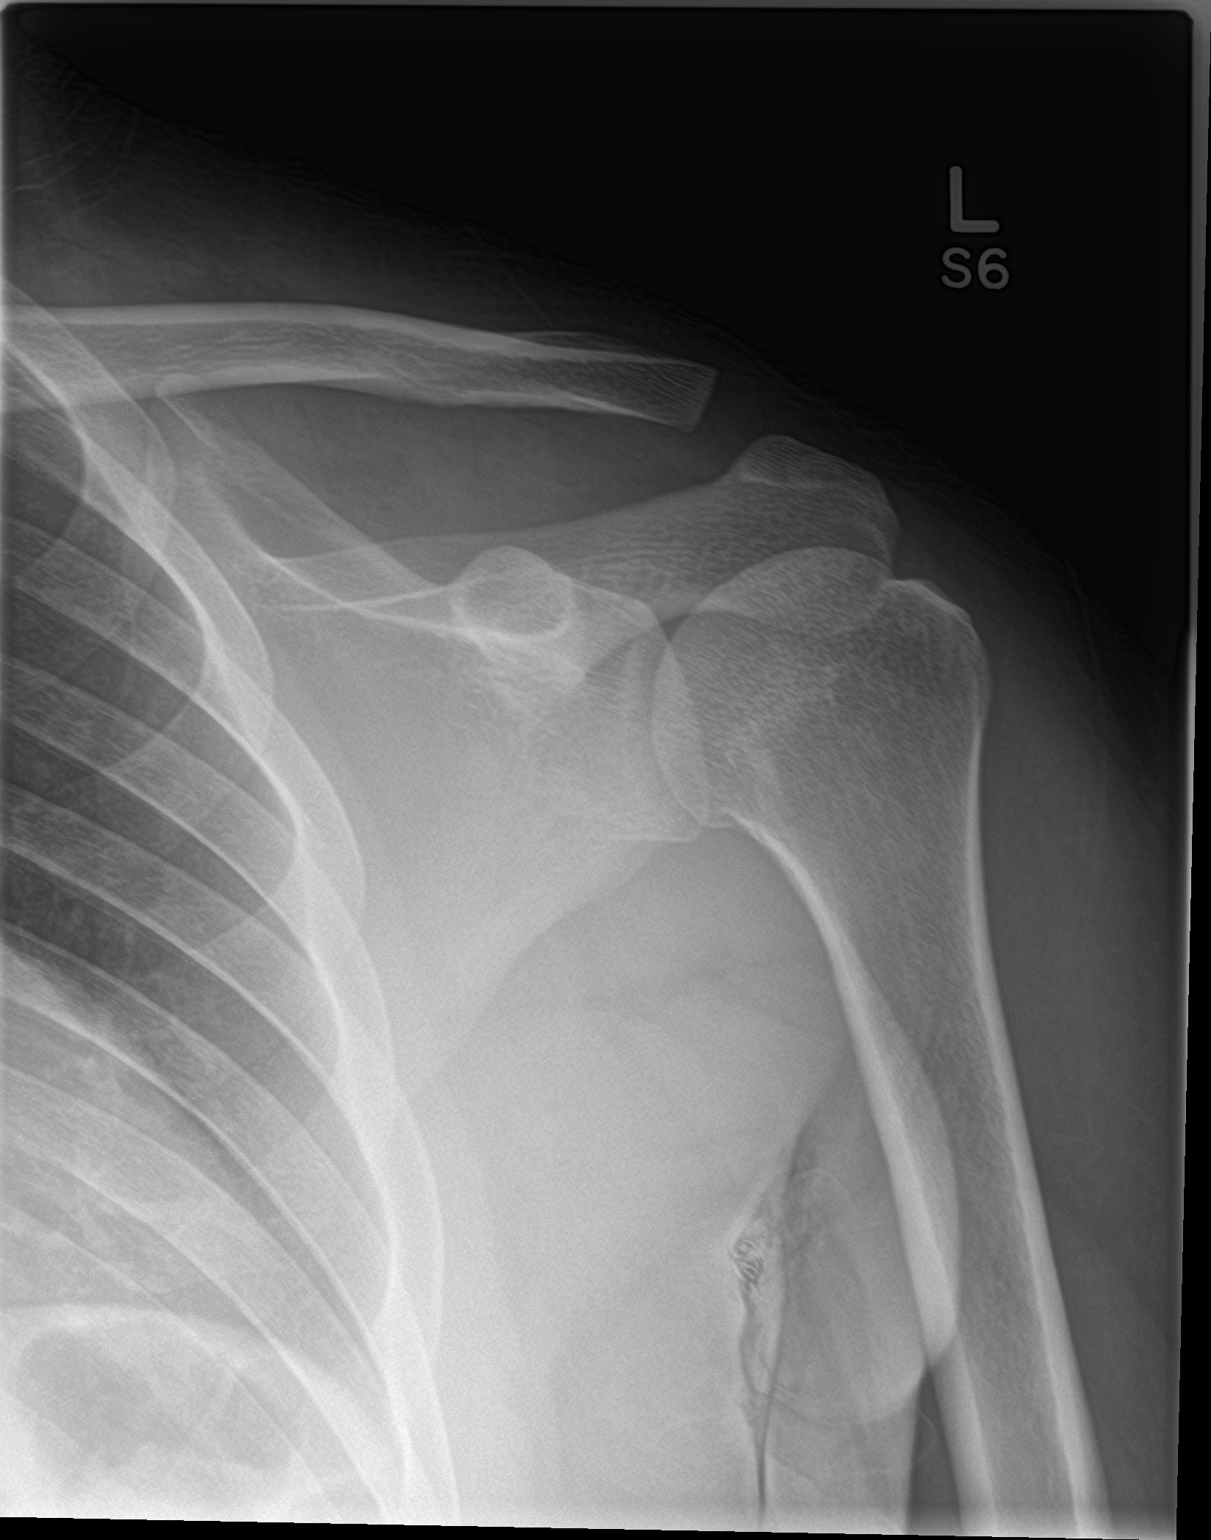

[shoulder y view]
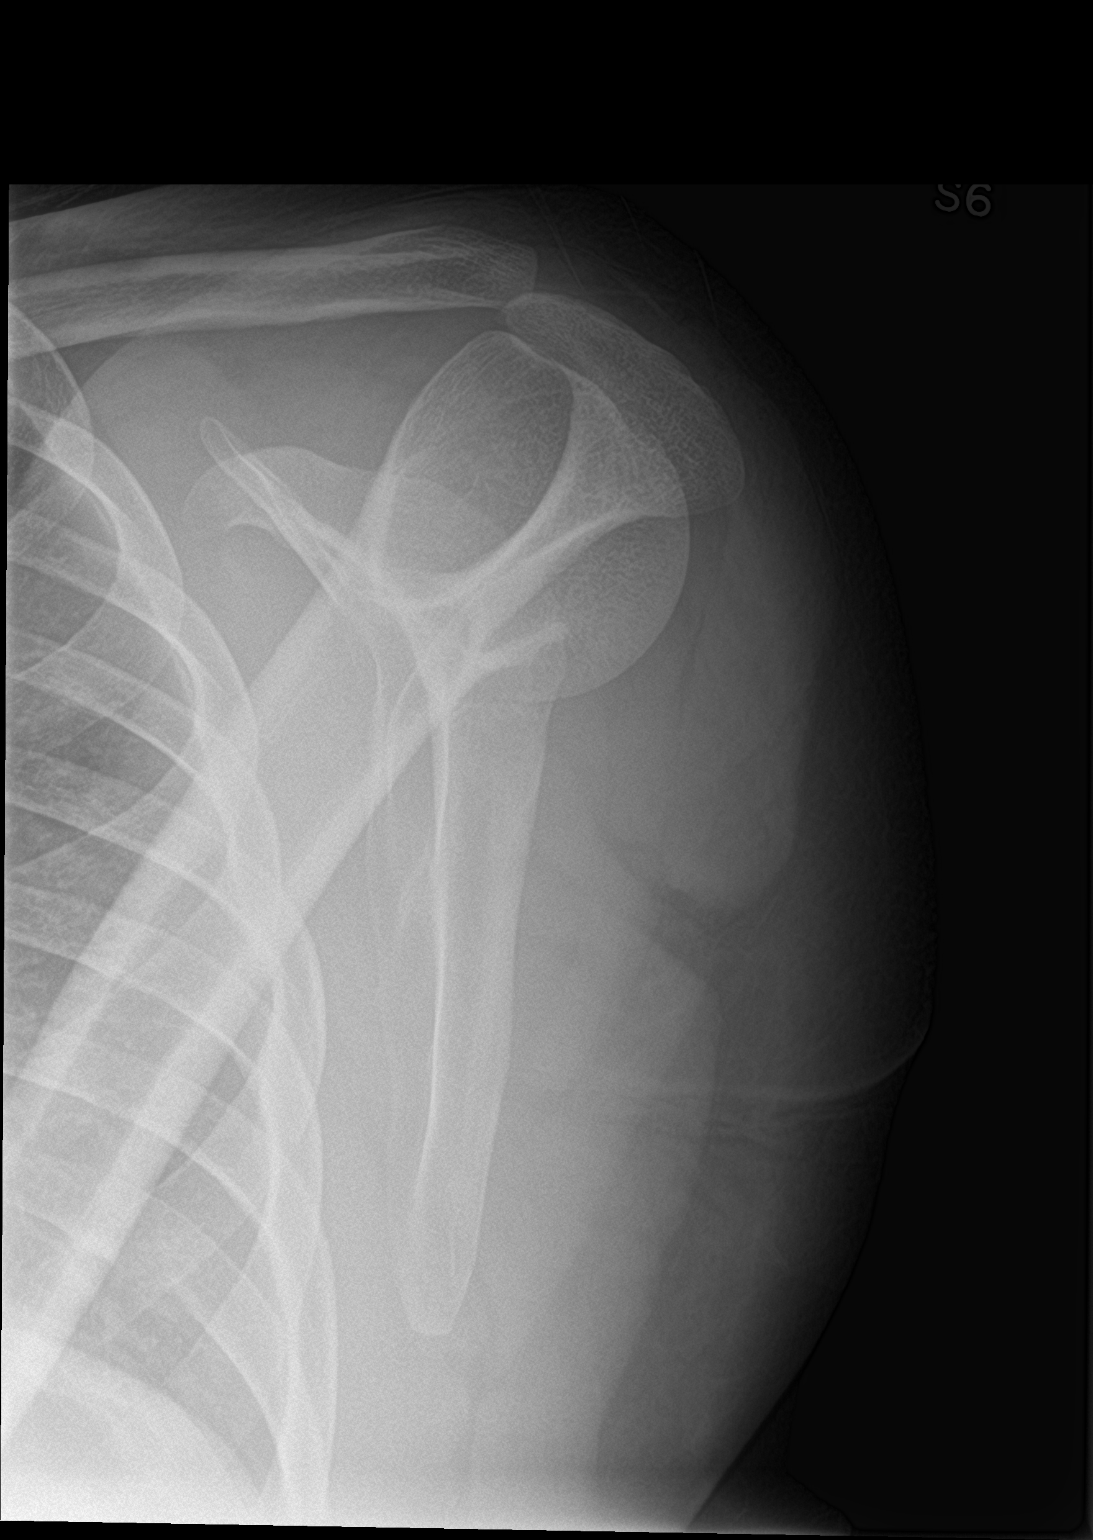

[shoulder axillary]
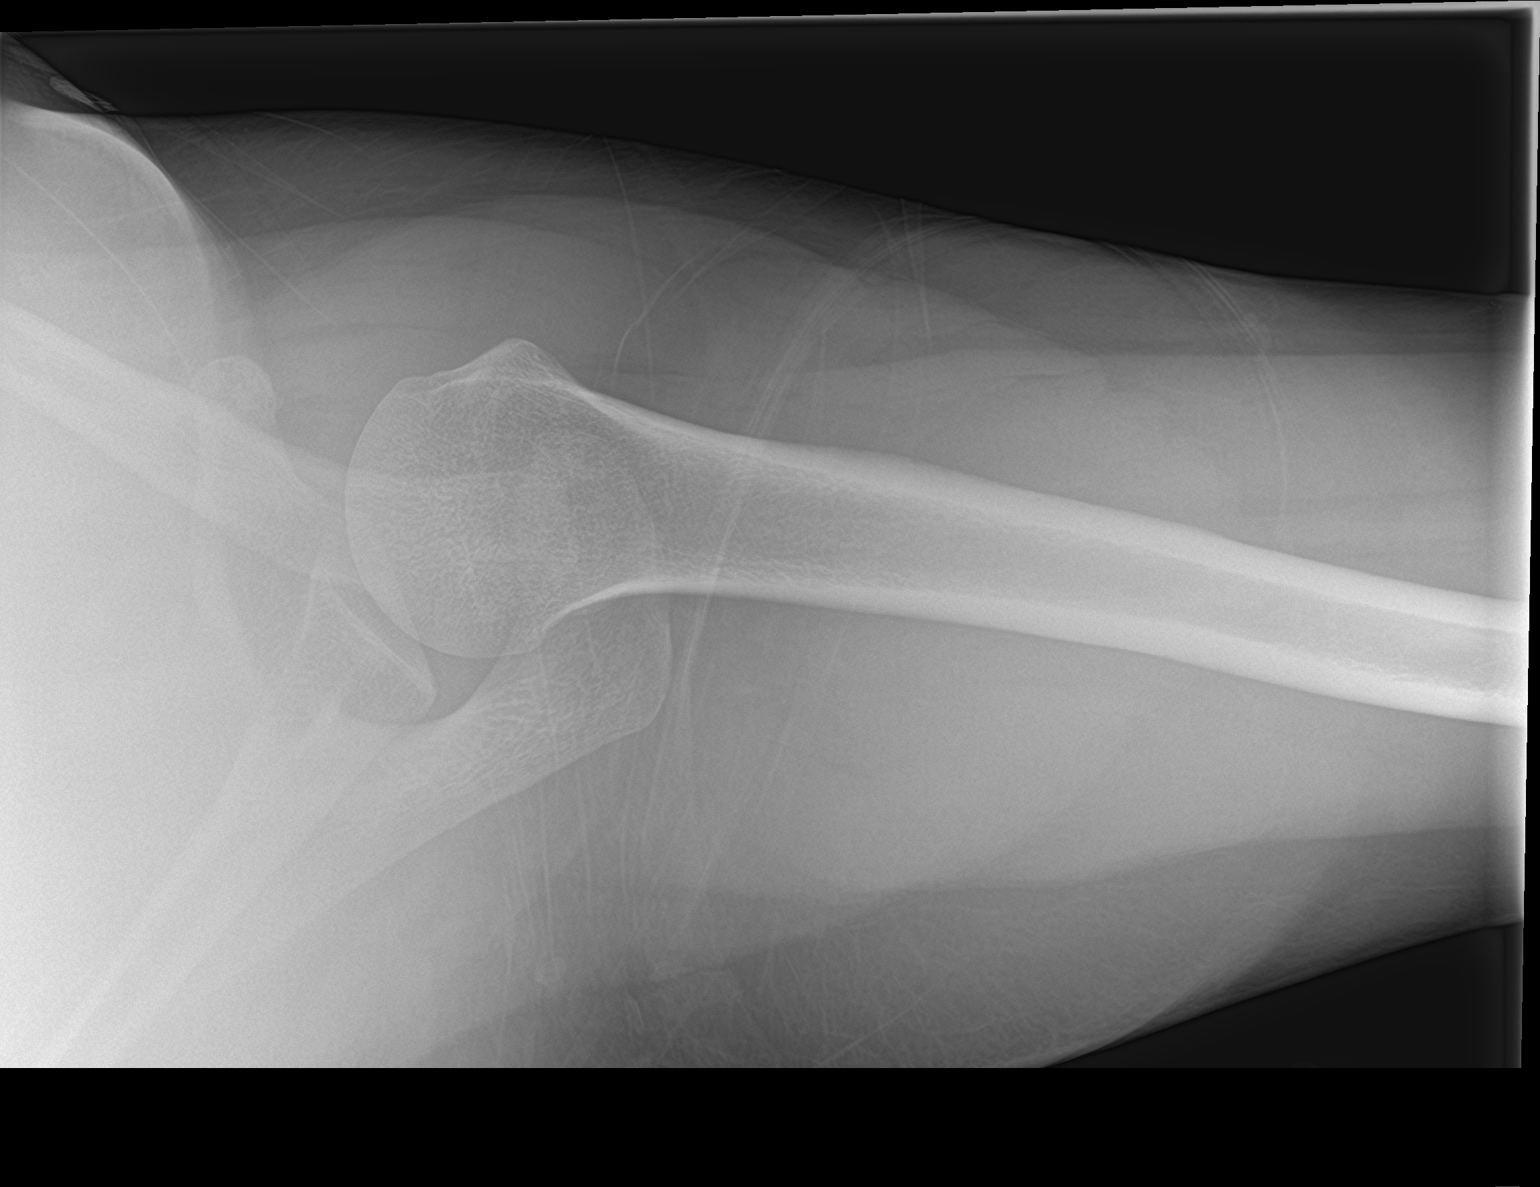

[3 of 3 positions shown; findings below may reference images not displayed]

FINDINGS: There is no evidence of fracture or dislocation. There is no
evidence of arthropathy or other focal bone abnormality. Soft
tissues are unremarkable.
IMPRESSION: Negative.

## 2020-01-08 ENCOUNTER — Ambulatory Visit
Admission: EM | Admit: 2020-01-08 | Discharge: 2020-01-08 | Disposition: A | Discharge status: Home / Self Care | Payer: Managed Care, Other (non HMO) | Attending: Family Medicine | Admitting: Family Medicine

## 2020-01-08 ENCOUNTER — Other Ambulatory Visit: Payer: Self-pay

## 2020-01-08 DIAGNOSIS — Z1152 Encounter for screening for COVID-19: Secondary | ICD-10-CM | POA: Diagnosis present

## 2020-01-08 DIAGNOSIS — Z20822 Contact with and (suspected) exposure to covid-19: Secondary | ICD-10-CM | POA: Diagnosis not present

## 2020-01-08 DIAGNOSIS — Z113 Encounter for screening for infections with a predominantly sexual mode of transmission: Secondary | ICD-10-CM | POA: Insufficient documentation

## 2020-01-08 DIAGNOSIS — R369 Urethral discharge, unspecified: Secondary | ICD-10-CM | POA: Insufficient documentation

## 2020-01-08 LAB — POCT URINALYSIS DIP (MANUAL ENTRY)
Bilirubin, UA: NEGATIVE
Glucose, UA: NEGATIVE mg/dL
Ketones, POC UA: NEGATIVE mg/dL
Nitrite, UA: NEGATIVE
Protein Ur, POC: NEGATIVE mg/dL
Spec Grav, UA: 1.025 (ref 1.010–1.025)
Urobilinogen, UA: 0.2 E.U./dL
pH, UA: 7 (ref 5.0–8.0)

## 2020-01-08 MED ORDER — CEFTRIAXONE SODIUM 500 MG IJ SOLR
500.0000 mg | Freq: Once | INTRAMUSCULAR | Status: AC
  Administered 2020-01-08: 500 mg via INTRAMUSCULAR

## 2020-01-08 MED ORDER — AZITHROMYCIN 500 MG PO TABS
1000.0000 mg | ORAL_TABLET | Freq: Once | ORAL | Status: AC
  Administered 2020-01-08: 1000 mg via ORAL

## 2020-01-08 MED ORDER — METRONIDAZOLE 500 MG PO TABS: 500.0000 mg | ORAL_TABLET | Freq: Two times a day (BID) | ORAL | 0 refills | Status: AC

## 2020-01-08 NOTE — Discharge Instructions (Addendum)
You were treated with an antibiotic today called Rocephin.  Take metronidazole twice a day for 7 days and doxycycline twice a day for 7 days.    Do not have sex for 7 days.  Your vaginal tests are pending.  If your test results are positive, we will call you.  You may need additional treatment and your partner(s) may also need treatment.      Your COVID test is pending.  You should self quarantine until the test result is back.    Take Tylenol as needed for fever or discomfort.  Rest and keep yourself hydrated.    Go to the emergency department if you develop acute worsening symptoms.

## 2020-01-08 NOTE — ED Provider Notes (Signed)
[facility]   [id number] 01/08/20 Arrival Time: 1230   CC: VAGINAL DISCHARGE  SUBJECTIVE:  Dylan Luna is a 29 y.o. male who presents with complaints of abrupt penile discharge that began 3 days ago.  He reports unprotected sex with 2 partners. Describes discharge as thick and green in color. Has not taken OTC medications for this. There are not aggravating or alleviating factors. He reports similar symptoms in the past and was diagnosed an STI but cannot recall which one.Denies fever, chills, nausea, vomiting, abdominal or pelvic pain, urinary symptoms. Requesting Covid testing while he is here. Denies headache, cough, nausea, vomiting, diarrhea, rash, fever, other symptoms.  ROS: As per HPI.  All other pertinent ROS negative.     History reviewed. No pertinent past medical history. History reviewed. No pertinent surgical history. No Known Allergies No current facility-administered medications on file prior to encounter.   Current Outpatient Medications on File Prior to Encounter  Medication Sig Dispense Refill  . benzonatate (TESSALON) 100 MG capsule Take 1 capsule (100 mg total) by mouth every 8 (eight) hours. 21 capsule 0  . ibuprofen (ADVIL,MOTRIN) 600 MG tablet Take 1 tablet (600 mg total) by mouth every 6 (six) hours as needed. 30 tablet 0  . lidocaine (LIDODERM) 5 % Place 1 patch onto the skin daily. Remove & Discard patch within 12 hours or as directed by MD 30 patch 0  . methocarbamol (ROBAXIN) 500 MG tablet Take 1 tablet (500 mg total) by mouth 2 (two) times daily. 20 tablet 0  . naproxen (NAPROSYN) 500 MG tablet Take 1 tablet (500 mg total) by mouth 2 (two) times daily with a meal. 30 tablet 0    Social History   Socioeconomic History  . Marital status: Single    Spouse name: Not on file  . Number of children: Not on file  . Years of education: Not on file  . Highest education level: Not on file  Occupational History  . Not on file  Tobacco Use  .  Smoking status: Current Every Day Smoker    Types: Cigarettes  . Smokeless tobacco: Never Used  Substance and Sexual Activity  . Alcohol use: No  . Drug use: No  . Sexual activity: Yes    Birth control/protection: None  Other Topics Concern  . Not on file  Social History Narrative  . Not on file   Social Determinants of Health   Financial Resource Strain:   . Difficulty of Paying Living Expenses:   Food Insecurity:   . Worried About Programme researcher, broadcasting/film/video in the Last Year:   . Barista in the Last Year:   Transportation Needs:   . Freight forwarder (Medical):   Marland Kitchen Lack of Transportation (Non-Medical):   Physical Activity:   . Days of Exercise per Week:   . Minutes of Exercise per Session:   Stress:   . Feeling of Stress :   Social Connections:   . Frequency of Communication with Friends and Family:   . Frequency of Social Gatherings with Friends and Family:   . Attends Religious Services:   . Active Member of Clubs or Organizations:   . Attends Banker Meetings:   Marland Kitchen Marital Status:   Intimate Partner Violence:   . Fear of Current or Ex-Partner:   . Emotionally Abused:   Marland Kitchen Physically Abused:   . Sexually Abused:    Family History  Problem Relation Age of Onset  . Diabetes  Mother   . Hyperlipidemia Mother   . Healthy Father     OBJECTIVE:  Vitals:   01/08/20 1250  BP: 127/80  Pulse: 60  Resp: 17  Temp: 98.9 F (37.2 C)  TempSrc: Oral  SpO2: 97%     General appearance: Alert, NAD, appears stated age Head: NCAT Throat: lips, mucosa, and tongue normal; teeth and gums normal Lungs: CTA bilaterally without adventitious breath sounds Heart: regular rate and rhythm.  Radial pulses 2+ symmetrical bilaterally Back: no CVA tenderness Abdomen: soft, non-tender; bowel sounds normal; no masses or organomegaly; no guarding or rebound tenderness GU: declines  Skin: warm and dry Psychological:  Alert and cooperative. Normal mood and  affect.  LABS:  Results for orders placed or performed during the hospital encounter of 01/08/20  POCT urinalysis dipstick  Result Value Ref Range   Color, UA yellow yellow   Clarity, UA cloudy (A) clear   Glucose, UA negative negative mg/dL   Bilirubin, UA negative negative   Ketones, POC UA negative negative mg/dL   Spec Grav, UA 6.073 7.106 - 1.025   Blood, UA moderate (A) negative   pH, UA 7.0 5.0 - 8.0   Protein Ur, POC negative negative mg/dL   Urobilinogen, UA 0.2 0.2 or 1.0 E.U./dL   Nitrite, UA Negative Negative   Leukocytes, UA Trace (A) Negative    Labs Reviewed  POCT URINALYSIS DIP (MANUAL ENTRY) - Abnormal; Notable for the following components:      Result Value   Clarity, UA cloudy (*)    Blood, UA moderate (*)    Leukocytes, UA Trace (*)    All other components within normal limits  NOVEL CORONAVIRUS, NAA  CYTOLOGY, (ORAL, ANAL, URETHRAL) ANCILLARY ONLY    ASSESSMENT & PLAN:  1. Penile discharge   2. Screen for STD (sexually transmitted disease)   3. Encounter for screening for COVID-19     Meds ordered this encounter  Medications  . cefTRIAXone (ROCEPHIN) injection 500 mg  . azithromycin (ZITHROMAX) tablet 1,000 mg  . metroNIDAZOLE (FLAGYL) 500 MG tablet    Sig: Take 1 tablet (500 mg total) by mouth 2 (two) times daily.    Dispense:  14 tablet    Refill:  0    Order Specific Question:   Supervising Provider    Answer:   Merrilee Jansky [[id number]]    Pending: Labs Reviewed  POCT URINALYSIS DIP (MANUAL ENTRY) - Abnormal; Notable for the following components:      Result Value   Clarity, UA cloudy (*)    Blood, UA moderate (*)    Leukocytes, UA Trace (*)    All other components within normal limits  NOVEL CORONAVIRUS, NAA  CYTOLOGY, (ORAL, ANAL, URETHRAL) ANCILLARY ONLY    Penile self-swab obtained.   We will follow up with you regarding abnormal results Given rocephin 250mg  injection and azithromycin 1g in office Declines HIV/ syphilis  testing today Prescribed metronidazole 500 mg twice daily for 7 days (do not take while consuming alcohol  Take medications as prescribed and to completion If tests results are positive, please abstain from sexual activity until you and your partner(s) have been treated  Follow up with PCP or [facility] if symptoms persists Return here or go to ER if you have any new or worsening symptoms fever, chills, nausea, vomiting, abdominal or pelvic pain, painful intercourse, vaginal discharge, vaginal bleeding, persistent symptoms despite treatment  Covid swab obtained in office today.  Patient instructed to quarantine until results  are back and negative.  If results are negative, patient may resume daily schedule as tolerated once they are fever free for 24 hours without the use of antipyretic medications.  If results are positive, patient instructed to quarantine 10 days from today.  Patient instructed to follow-up with primary care with this office as needed.  Patient instructed to follow-up in the ER for trouble swallowing, trouble breathing, other concerning symptoms.  Reviewed expectations re: course of current medical issues. Questions answered. Outlined signs and symptoms indicating need for more acute intervention. Patient verbalized understanding. After Visit Summary given.       Moshe Cipro, NP 01/08/20 1341

## 2020-01-08 NOTE — ED Triage Notes (Signed)
Pt is here with penile discharge for 3-4 days  Now, pt last had unprotected sex with his girlfriend 3-4 days ago.

## 2020-01-09 LAB — CYTOLOGY, (ORAL, ANAL, URETHRAL) ANCILLARY ONLY
Chlamydia: POSITIVE — AB
Comment: NEGATIVE
Comment: NEGATIVE
Comment: NORMAL
Neisseria Gonorrhea: NEGATIVE
Trichomonas: NEGATIVE

## 2020-01-10 LAB — NOVEL CORONAVIRUS, NAA: SARS-CoV-2, NAA: NOT DETECTED

## 2020-01-10 LAB — SARS-COV-2, NAA 2 DAY TAT

## 2020-03-26 ENCOUNTER — Ambulatory Visit
Admission: EM | Admit: 2020-03-26 | Discharge: 2020-03-26 | Disposition: A | Discharge status: Home / Self Care | Payer: Managed Care, Other (non HMO) | Attending: Family Medicine | Admitting: Family Medicine

## 2020-03-26 DIAGNOSIS — Z20822 Contact with and (suspected) exposure to covid-19: Secondary | ICD-10-CM | POA: Diagnosis not present

## 2020-03-26 NOTE — Discharge Instructions (Signed)

## 2020-03-26 NOTE — ED Triage Notes (Signed)
Pt here for covid testing, no symptoms. Pt had an exposure to covid

## 2020-03-28 LAB — SARS-COV-2, NAA 2 DAY TAT

## 2020-03-28 LAB — NOVEL CORONAVIRUS, NAA: SARS-CoV-2, NAA: NOT DETECTED
